# Patient Record
Sex: Female | Born: 1945 | Race: White | Hispanic: No | Marital: Married
Health system: Southern US, Community
[De-identification: ages and names within clinical notes are randomized; demographics above are authoritative.]

---

## 1998-02-24 ENCOUNTER — Encounter: Payer: Self-pay | Admitting: Obstetrics and Gynecology

## 1998-02-24 ENCOUNTER — Ambulatory Visit (HOSPITAL_COMMUNITY): Admission: RE | Admit: 1998-02-24 | Discharge: 1998-02-24 | Payer: Self-pay | Admitting: Obstetrics and Gynecology

## 1999-03-30 ENCOUNTER — Encounter: Admission: RE | Admit: 1999-03-30 | Discharge: 1999-03-30 | Payer: Self-pay | Admitting: Obstetrics and Gynecology

## 1999-03-30 ENCOUNTER — Encounter: Payer: Self-pay | Admitting: Obstetrics and Gynecology

## 1999-04-01 ENCOUNTER — Other Ambulatory Visit: Admission: RE | Admit: 1999-04-01 | Discharge: 1999-04-01 | Payer: Self-pay | Admitting: Obstetrics and Gynecology

## 1999-12-18 ENCOUNTER — Other Ambulatory Visit: Admission: RE | Admit: 1999-12-18 | Discharge: 1999-12-18 | Payer: Self-pay | Admitting: Orthopedic Surgery

## 2000-05-13 ENCOUNTER — Other Ambulatory Visit: Admission: RE | Admit: 2000-05-13 | Discharge: 2000-05-13 | Payer: Self-pay | Admitting: Obstetrics and Gynecology

## 2000-05-16 ENCOUNTER — Encounter: Payer: Self-pay | Admitting: Obstetrics and Gynecology

## 2000-05-16 ENCOUNTER — Encounter: Admission: RE | Admit: 2000-05-16 | Discharge: 2000-05-16 | Payer: Self-pay | Admitting: Obstetrics and Gynecology

## 2002-09-16 ENCOUNTER — Encounter: Payer: Self-pay | Admitting: Emergency Medicine

## 2002-09-16 ENCOUNTER — Encounter: Payer: Self-pay | Admitting: Gastroenterology

## 2002-09-16 ENCOUNTER — Inpatient Hospital Stay (HOSPITAL_COMMUNITY): Admission: EM | Admit: 2002-09-16 | Discharge: 2002-09-18 | Payer: Self-pay | Admitting: Emergency Medicine

## 2002-09-17 ENCOUNTER — Encounter: Payer: Self-pay | Admitting: General Surgery

## 2002-09-20 ENCOUNTER — Inpatient Hospital Stay (HOSPITAL_COMMUNITY): Admission: EM | Admit: 2002-09-20 | Discharge: 2002-09-23 | Payer: Self-pay | Admitting: *Deleted

## 2002-09-20 ENCOUNTER — Encounter: Payer: Self-pay | Admitting: Surgery

## 2002-09-21 ENCOUNTER — Encounter: Payer: Self-pay | Admitting: Gastroenterology

## 2002-10-08 ENCOUNTER — Encounter: Payer: Self-pay | Admitting: Gastroenterology

## 2002-10-08 ENCOUNTER — Observation Stay (HOSPITAL_COMMUNITY): Admission: RE | Admit: 2002-10-08 | Discharge: 2002-10-09 | Payer: Self-pay | Admitting: Gastroenterology

## 2003-12-05 ENCOUNTER — Ambulatory Visit (HOSPITAL_COMMUNITY): Admission: RE | Admit: 2003-12-05 | Discharge: 2003-12-05 | Payer: Self-pay | Admitting: Family Medicine

## 2005-01-07 ENCOUNTER — Ambulatory Visit (HOSPITAL_COMMUNITY): Admission: RE | Admit: 2005-01-07 | Discharge: 2005-01-07 | Payer: Self-pay | Admitting: Family Medicine

## 2005-03-18 ENCOUNTER — Other Ambulatory Visit: Admission: RE | Admit: 2005-03-18 | Discharge: 2005-03-18 | Payer: Self-pay | Admitting: Family Medicine

## 2005-11-27 ENCOUNTER — Inpatient Hospital Stay (HOSPITAL_COMMUNITY): Admission: RE | Admit: 2005-11-27 | Discharge: 2005-11-30 | Payer: Self-pay | Admitting: Orthopedic Surgery

## 2006-01-18 ENCOUNTER — Ambulatory Visit (HOSPITAL_COMMUNITY): Admission: RE | Admit: 2006-01-18 | Discharge: 2006-01-18 | Payer: Self-pay | Admitting: Internal Medicine

## 2008-06-29 IMAGING — CR DG CHEST 2V
2 series · 2 of 2 positions shown · non-contrast
Comparison: report of CT 09/20/02

CLINICAL DATA: Pre-op for right knee osteoarthritis.  
 [id number] VIEWS:

[view not recorded (1 of 2)]
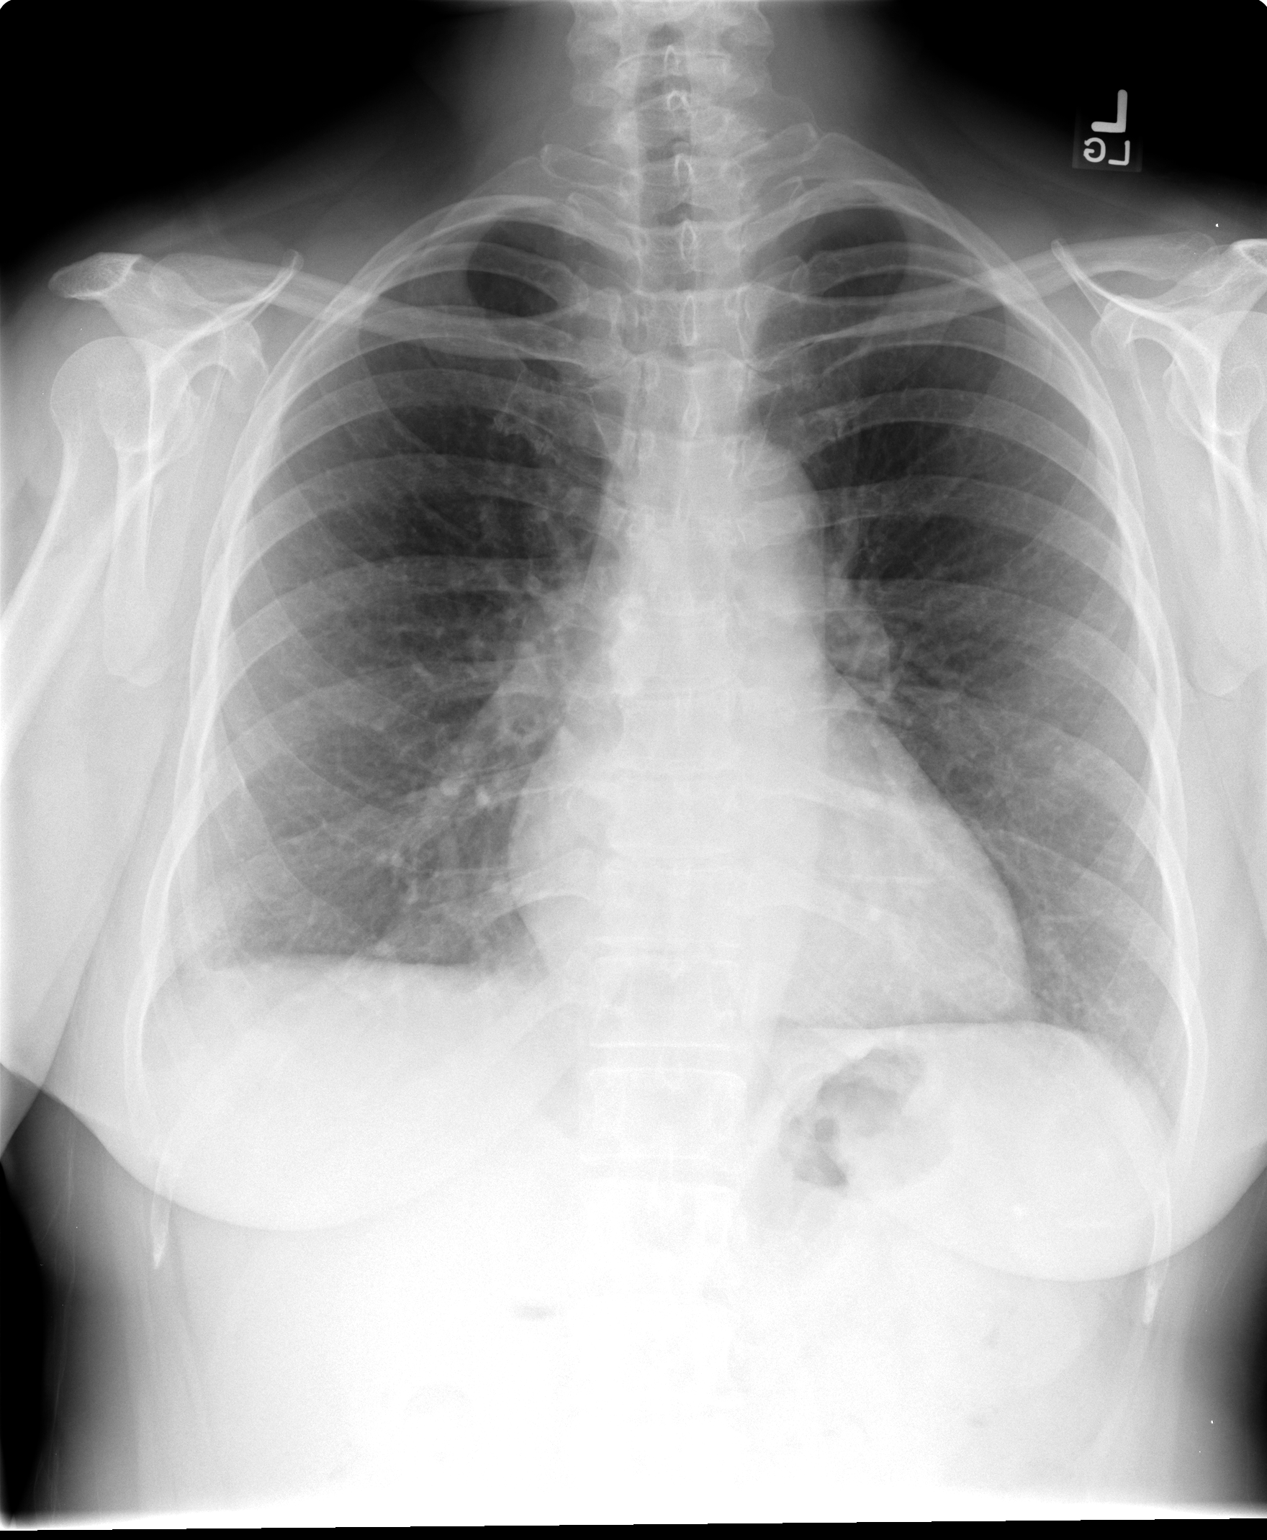

[view not recorded (2 of 2)]
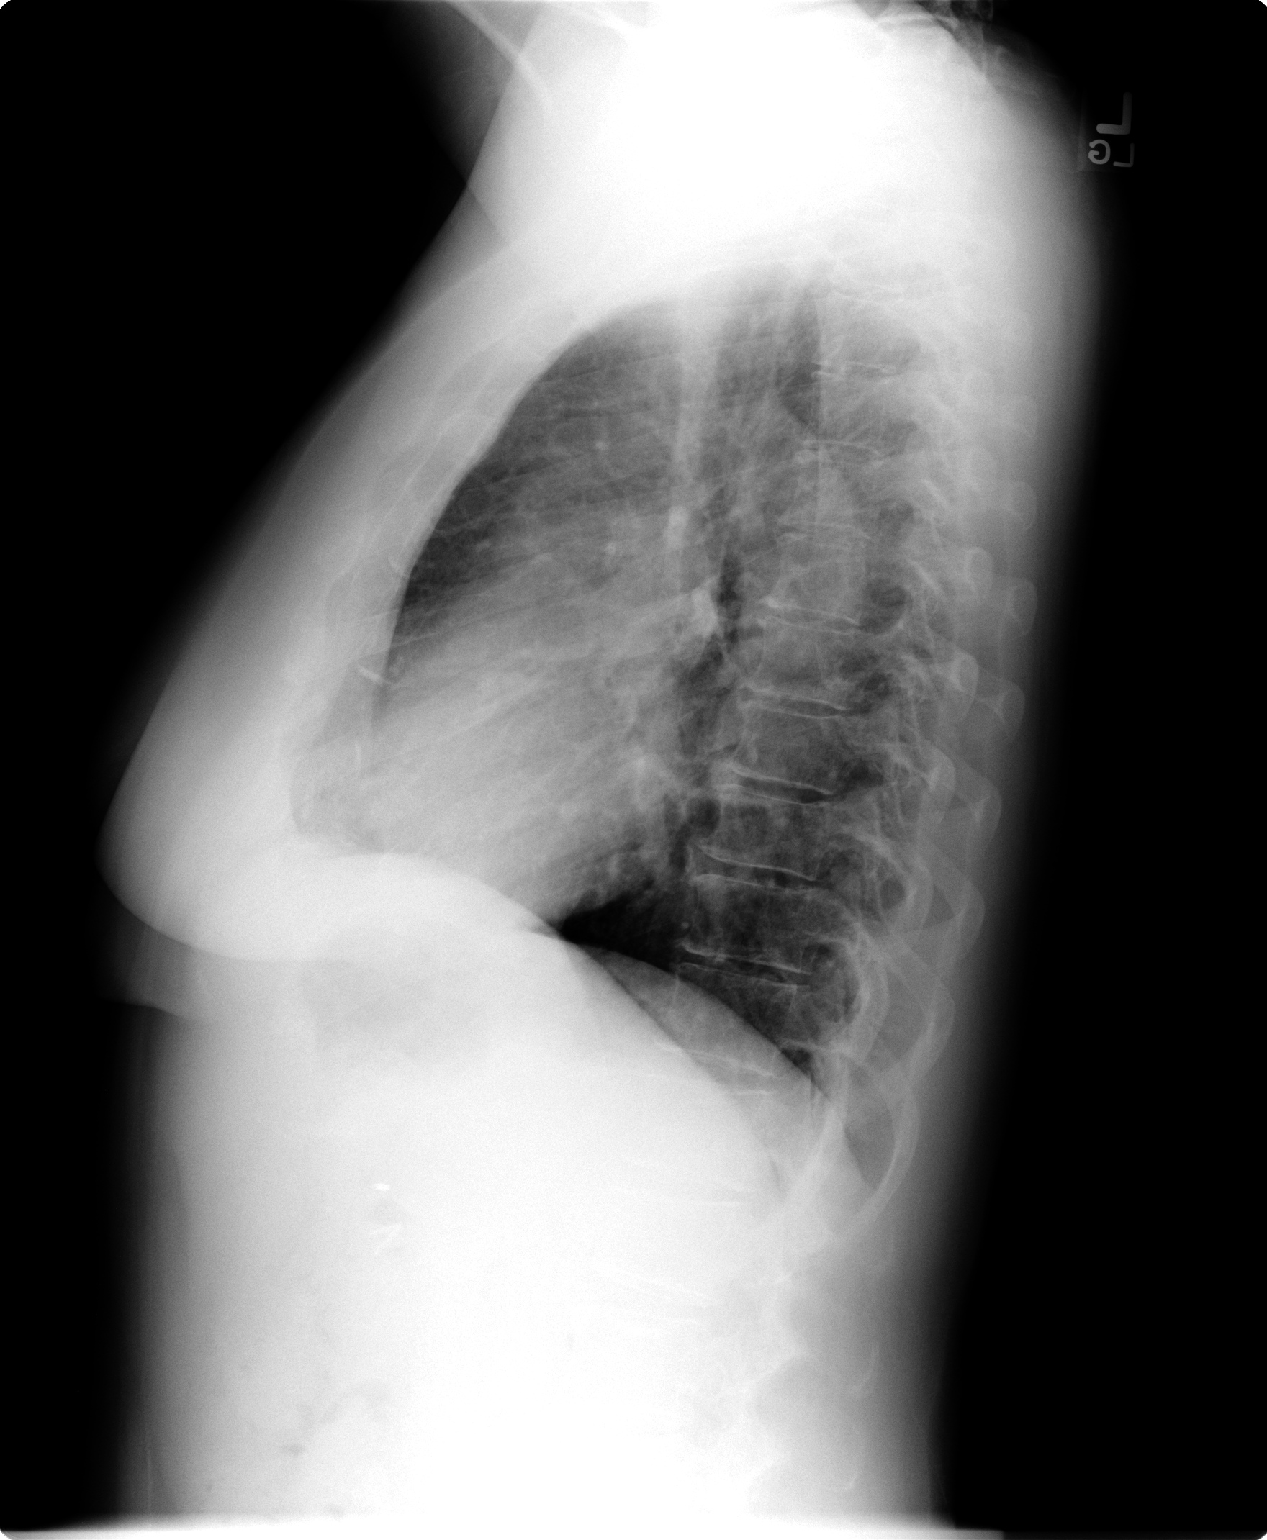

[2 of 2 positions shown; findings below may reference images not displayed]

FINDINGS: Midline trachea.  Heart size normal.  Mediastinal contours within normal limits. Costophrenic angles are sharp.  There is peribronchial thickening, likely chronic.  There is partial obscuration of the lateral aspect of the right hemidiaphragm.  This likely relates to an area of scar or atelectasis.  No concurrent opacity on the lateral view to suggest pneumonia.  Prior cholecystectomy.
IMPRESSION: 1.  No acute cardiopulmonary disease.  
 2.  Peribronchial thickening likely relates to chronic bronchitis or smoking.  
 3.  Minimal opacity in the lateral aspect of the right lung base likely relates to scar or atelectasis.

## 2020-05-19 ENCOUNTER — Telehealth: Payer: Self-pay

## 2020-05-19 NOTE — Telephone Encounter (Signed)
A user error has taken place: charting done on wrong patient and has been corrected.

## 2020-05-19 NOTE — Telephone Encounter (Deleted)
Received PT initial evaluation for patient, plan of care/recommendations: 2x/week over the next 4 weeks.  Placed on PCP desk to review and sign, if appropriate.

## 2022-09-21 ENCOUNTER — Other Ambulatory Visit: Payer: Self-pay | Admitting: *Deleted
